# Patient Record
Sex: Male | Born: 1985
Health system: Southern US, Community
[De-identification: ages and names within clinical notes are randomized; demographics above are authoritative.]

## PROBLEM LIST (undated history)

## (undated) DIAGNOSIS — K219 Gastro-esophageal reflux disease without esophagitis: Secondary | ICD-10-CM

## (undated) DIAGNOSIS — K649 Unspecified hemorrhoids: Secondary | ICD-10-CM

## (undated) HISTORY — DX: Unspecified hemorrhoids: K64.9

## (undated) HISTORY — DX: Gastro-esophageal reflux disease without esophagitis: K21.9

---

## 2017-03-25 ENCOUNTER — Ambulatory Visit
Admission: RE | Admit: 2017-03-25 | Discharge: 2017-03-25 | Disposition: A | Discharge status: Home / Self Care | Payer: BC Managed Care – PPO | Source: Ambulatory Visit | Attending: Family Medicine | Admitting: Family Medicine

## 2017-03-25 ENCOUNTER — Other Ambulatory Visit: Payer: Self-pay | Admitting: Family Medicine

## 2017-03-25 DIAGNOSIS — K5900 Constipation, unspecified: Secondary | ICD-10-CM

## 2017-09-24 ENCOUNTER — Telehealth: Payer: Self-pay | Admitting: *Deleted

## 2017-09-24 ENCOUNTER — Ambulatory Visit: Payer: BC Managed Care – PPO | Admitting: Neurology

## 2017-09-24 NOTE — Telephone Encounter (Signed)
No showed new patient appointment. 

## 2017-09-25 ENCOUNTER — Encounter: Payer: Self-pay | Admitting: Neurology

## 2017-10-07 ENCOUNTER — Other Ambulatory Visit: Payer: Self-pay | Admitting: Physician Assistant

## 2017-10-07 DIAGNOSIS — R1032 Left lower quadrant pain: Secondary | ICD-10-CM

## 2017-10-07 DIAGNOSIS — K5900 Constipation, unspecified: Secondary | ICD-10-CM

## 2017-10-23 ENCOUNTER — Other Ambulatory Visit: Payer: BC Managed Care – PPO

## 2019-06-21 DIAGNOSIS — Z20828 Contact with and (suspected) exposure to other viral communicable diseases: Secondary | ICD-10-CM | POA: Diagnosis not present

## 2019-06-21 DIAGNOSIS — J45901 Unspecified asthma with (acute) exacerbation: Secondary | ICD-10-CM | POA: Diagnosis not present

## 2019-06-21 DIAGNOSIS — J209 Acute bronchitis, unspecified: Secondary | ICD-10-CM | POA: Diagnosis not present

## 2019-07-12 DIAGNOSIS — J209 Acute bronchitis, unspecified: Secondary | ICD-10-CM | POA: Diagnosis not present

## 2019-10-06 DIAGNOSIS — F4010 Social phobia, unspecified: Secondary | ICD-10-CM | POA: Diagnosis not present

## 2019-10-06 DIAGNOSIS — F439 Reaction to severe stress, unspecified: Secondary | ICD-10-CM | POA: Diagnosis not present

## 2019-10-14 DIAGNOSIS — F4010 Social phobia, unspecified: Secondary | ICD-10-CM | POA: Diagnosis not present

## 2019-10-14 DIAGNOSIS — F439 Reaction to severe stress, unspecified: Secondary | ICD-10-CM | POA: Diagnosis not present

## 2019-10-21 DIAGNOSIS — F439 Reaction to severe stress, unspecified: Secondary | ICD-10-CM | POA: Diagnosis not present

## 2019-10-21 DIAGNOSIS — F4010 Social phobia, unspecified: Secondary | ICD-10-CM | POA: Diagnosis not present

## 2019-10-28 DIAGNOSIS — F4010 Social phobia, unspecified: Secondary | ICD-10-CM | POA: Diagnosis not present

## 2019-10-28 DIAGNOSIS — F439 Reaction to severe stress, unspecified: Secondary | ICD-10-CM | POA: Diagnosis not present

## 2019-11-18 DIAGNOSIS — F4310 Post-traumatic stress disorder, unspecified: Secondary | ICD-10-CM | POA: Diagnosis not present

## 2019-11-18 DIAGNOSIS — F4010 Social phobia, unspecified: Secondary | ICD-10-CM | POA: Diagnosis not present

## 2019-11-25 DIAGNOSIS — F4310 Post-traumatic stress disorder, unspecified: Secondary | ICD-10-CM | POA: Diagnosis not present

## 2019-11-25 DIAGNOSIS — F4010 Social phobia, unspecified: Secondary | ICD-10-CM | POA: Diagnosis not present

## 2020-10-23 ENCOUNTER — Ambulatory Visit (HOSPITAL_BASED_OUTPATIENT_CLINIC_OR_DEPARTMENT_OTHER)
Admission: RE | Admit: 2020-10-23 | Discharge: 2020-10-23 | Disposition: A | Discharge status: Home / Self Care | Payer: BC Managed Care – PPO | Source: Ambulatory Visit | Attending: Family Medicine | Admitting: Family Medicine

## 2020-10-23 ENCOUNTER — Other Ambulatory Visit (HOSPITAL_BASED_OUTPATIENT_CLINIC_OR_DEPARTMENT_OTHER): Payer: Self-pay | Admitting: Family Medicine

## 2020-10-23 ENCOUNTER — Other Ambulatory Visit: Payer: Self-pay

## 2020-10-23 ENCOUNTER — Encounter (INDEPENDENT_AMBULATORY_CARE_PROVIDER_SITE_OTHER): Payer: Self-pay

## 2020-10-23 DIAGNOSIS — R197 Diarrhea, unspecified: Secondary | ICD-10-CM | POA: Diagnosis not present

## 2020-10-23 DIAGNOSIS — R1033 Periumbilical pain: Secondary | ICD-10-CM

## 2020-10-23 DIAGNOSIS — R102 Pelvic and perineal pain: Secondary | ICD-10-CM | POA: Diagnosis not present

## 2020-10-23 DIAGNOSIS — K648 Other hemorrhoids: Secondary | ICD-10-CM | POA: Diagnosis not present

## 2020-10-23 MED ORDER — IOHEXOL 300 MG/ML  SOLN
100.0000 mL | Freq: Once | INTRAMUSCULAR | Status: AC | PRN
  Administered 2020-10-23: 100 mL via INTRAVENOUS

## 2021-09-24 DIAGNOSIS — M79605 Pain in left leg: Secondary | ICD-10-CM | POA: Diagnosis not present

## 2021-09-24 DIAGNOSIS — R202 Paresthesia of skin: Secondary | ICD-10-CM | POA: Diagnosis not present

## 2021-09-24 DIAGNOSIS — M791 Myalgia, unspecified site: Secondary | ICD-10-CM | POA: Diagnosis not present

## 2021-09-24 DIAGNOSIS — M79604 Pain in right leg: Secondary | ICD-10-CM | POA: Diagnosis not present

## 2021-10-24 DIAGNOSIS — M79661 Pain in right lower leg: Secondary | ICD-10-CM | POA: Diagnosis not present

## 2021-10-24 DIAGNOSIS — M79662 Pain in left lower leg: Secondary | ICD-10-CM | POA: Diagnosis not present

## 2021-11-19 DIAGNOSIS — M79671 Pain in right foot: Secondary | ICD-10-CM | POA: Diagnosis not present

## 2021-11-19 DIAGNOSIS — M2011 Hallux valgus (acquired), right foot: Secondary | ICD-10-CM | POA: Diagnosis not present

## 2021-11-19 DIAGNOSIS — M2012 Hallux valgus (acquired), left foot: Secondary | ICD-10-CM | POA: Diagnosis not present

## 2022-02-21 DIAGNOSIS — R41840 Attention and concentration deficit: Secondary | ICD-10-CM | POA: Diagnosis not present

## 2022-02-21 DIAGNOSIS — Z0000 Encounter for general adult medical examination without abnormal findings: Secondary | ICD-10-CM | POA: Diagnosis not present

## 2022-02-21 DIAGNOSIS — M79604 Pain in right leg: Secondary | ICD-10-CM | POA: Diagnosis not present

## 2022-02-21 DIAGNOSIS — Z23 Encounter for immunization: Secondary | ICD-10-CM | POA: Diagnosis not present

## 2022-07-24 DIAGNOSIS — Z800 Family history of malignant neoplasm of digestive organs: Secondary | ICD-10-CM | POA: Diagnosis not present

## 2022-07-24 DIAGNOSIS — R634 Abnormal weight loss: Secondary | ICD-10-CM | POA: Diagnosis not present

## 2022-07-24 DIAGNOSIS — R1032 Left lower quadrant pain: Secondary | ICD-10-CM | POA: Diagnosis not present

## 2022-07-24 DIAGNOSIS — R197 Diarrhea, unspecified: Secondary | ICD-10-CM | POA: Diagnosis not present

## 2022-07-31 DIAGNOSIS — R197 Diarrhea, unspecified: Secondary | ICD-10-CM | POA: Diagnosis not present

## 2022-10-06 DIAGNOSIS — K6389 Other specified diseases of intestine: Secondary | ICD-10-CM | POA: Diagnosis not present

## 2022-10-06 DIAGNOSIS — R1032 Left lower quadrant pain: Secondary | ICD-10-CM | POA: Diagnosis not present

## 2022-10-06 DIAGNOSIS — Z800 Family history of malignant neoplasm of digestive organs: Secondary | ICD-10-CM | POA: Diagnosis not present

## 2022-10-06 DIAGNOSIS — R197 Diarrhea, unspecified: Secondary | ICD-10-CM | POA: Diagnosis not present

## 2022-10-06 DIAGNOSIS — K621 Rectal polyp: Secondary | ICD-10-CM | POA: Diagnosis not present

## 2022-10-06 DIAGNOSIS — K635 Polyp of colon: Secondary | ICD-10-CM | POA: Diagnosis not present

## 2022-10-06 DIAGNOSIS — R634 Abnormal weight loss: Secondary | ICD-10-CM | POA: Diagnosis not present

## 2022-10-06 DIAGNOSIS — K648 Other hemorrhoids: Secondary | ICD-10-CM | POA: Diagnosis not present

## 2022-10-06 DIAGNOSIS — D120 Benign neoplasm of cecum: Secondary | ICD-10-CM | POA: Diagnosis not present

## 2023-01-08 DIAGNOSIS — K648 Other hemorrhoids: Secondary | ICD-10-CM | POA: Diagnosis not present

## 2023-02-24 DIAGNOSIS — Z13220 Encounter for screening for lipoid disorders: Secondary | ICD-10-CM | POA: Diagnosis not present

## 2023-02-24 DIAGNOSIS — Z125 Encounter for screening for malignant neoplasm of prostate: Secondary | ICD-10-CM | POA: Diagnosis not present

## 2023-02-24 DIAGNOSIS — Z0000 Encounter for general adult medical examination without abnormal findings: Secondary | ICD-10-CM | POA: Diagnosis not present

## 2023-02-26 IMAGING — CT CT ABD-PELV W/ CM
2 of 4 series · 16 of 46 positions shown, 18 images · IV contrast (Omnipaque)
Comparison: Plain films 03/25/2017

CLINICAL DATA: Periumbilical and lower pelvic pain. Worsening over
last 2 weeks. Chronic hemorrhoids.

EXAM:
CT ABDOMEN AND PELVIS WITH CONTRAST
TECHNIQUE: Multidetector CT imaging of the abdomen and pelvis was performed
using the standard protocol following bolus administration of
intravenous contrast.
CONTRAST:  100mL OMNIPAQUE IOHEXOL 300 MG/ML  SOLN

[Series 2: axial st · axial · 0.78mm/px · z∈[+241,+636]mm · 13 of 87 slices shown, 15 images]
[im 4/87  soft-tissue]
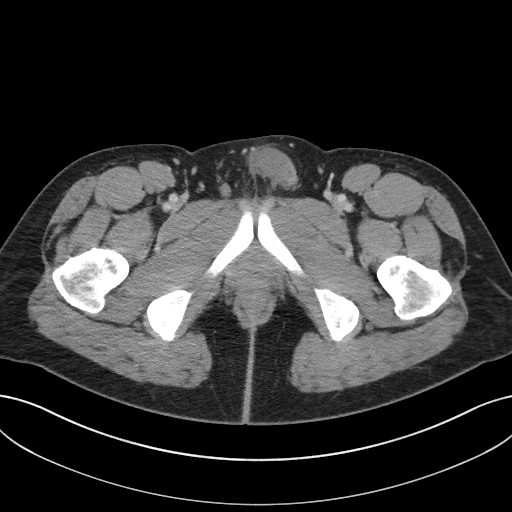
[im 4/87  bone]
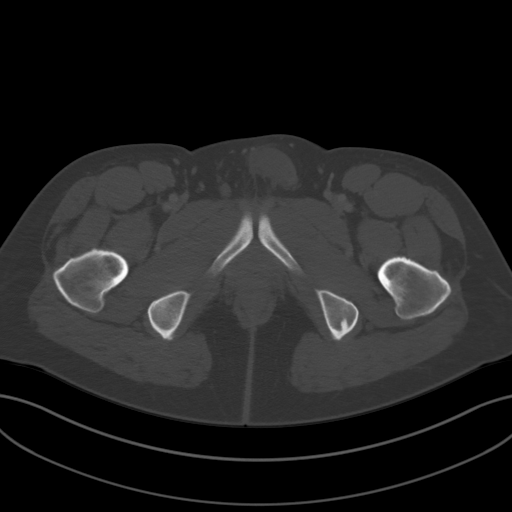
[im 11/87  soft-tissue]
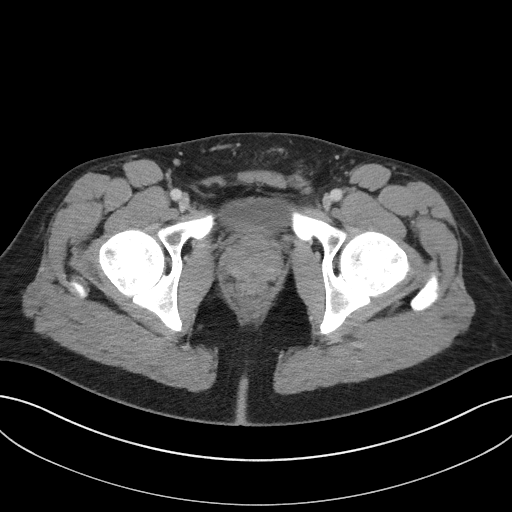
[im 18/87  soft-tissue]
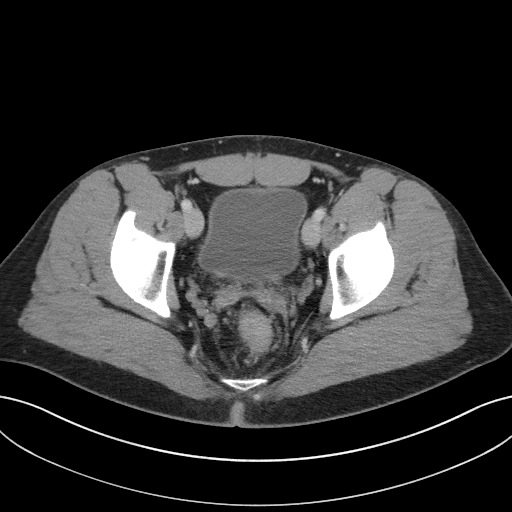
[im 26/87  soft-tissue]
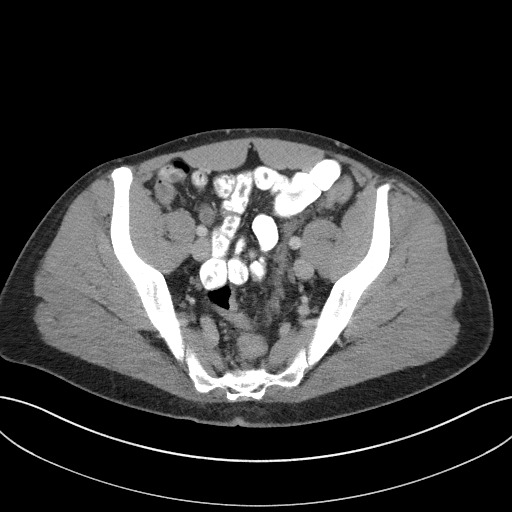
[im 29/87  soft-tissue]
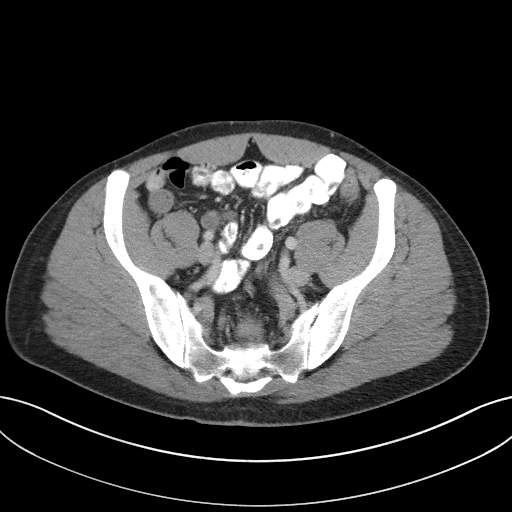
[im 36/87  soft-tissue]
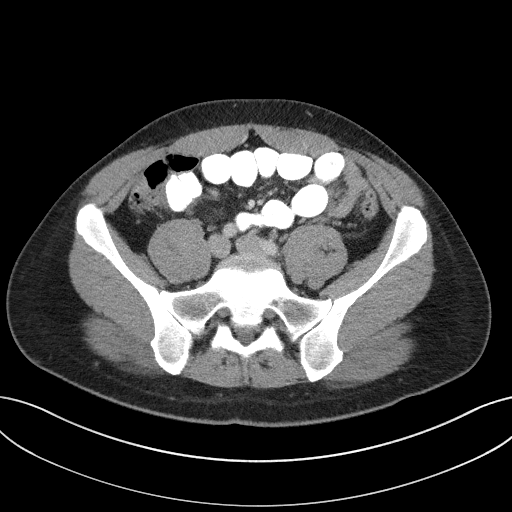
[im 44/87  soft-tissue]
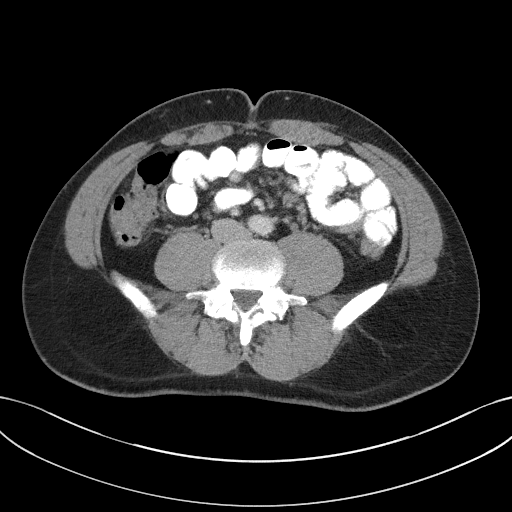
[im 51/87  soft-tissue]
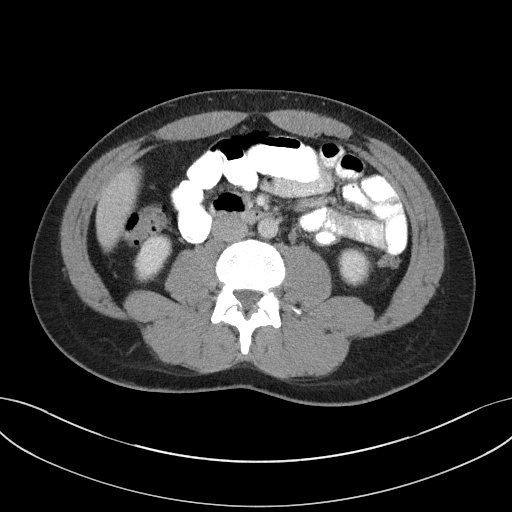
[im 58/87  soft-tissue]
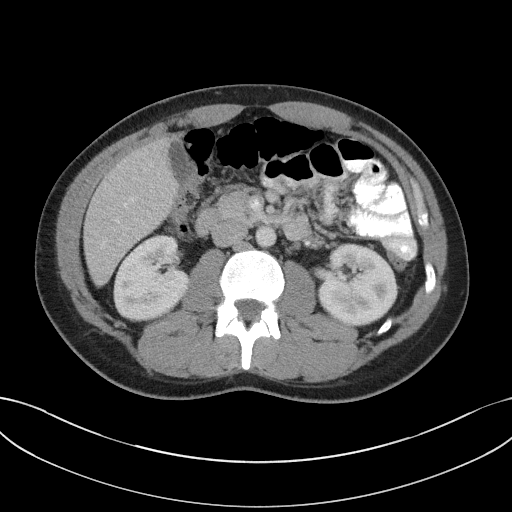
[im 58/87  bone]
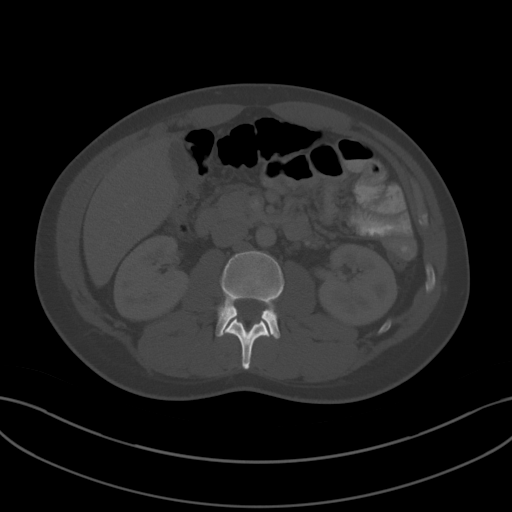
[im 61/87  soft-tissue]
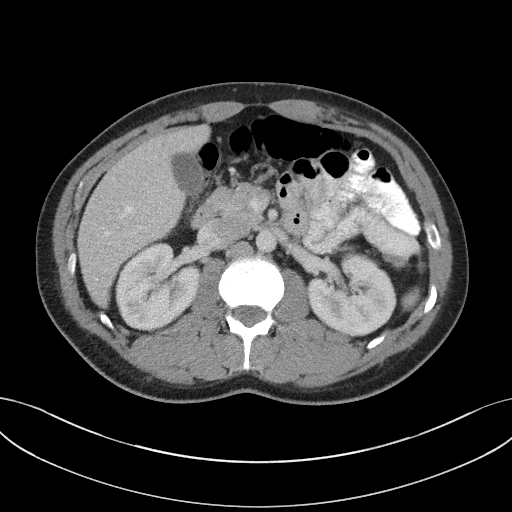
[im 69/87  soft-tissue]
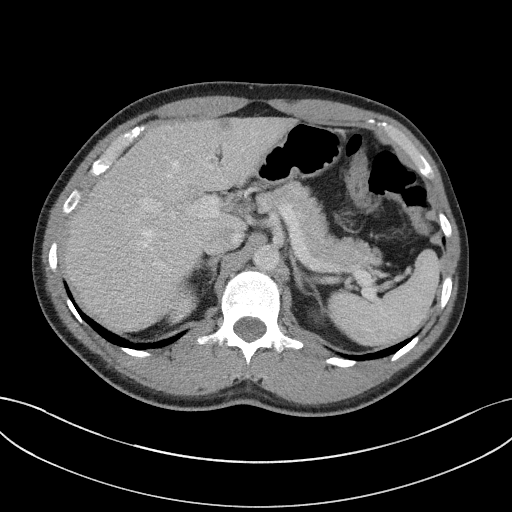
[im 76/87  soft-tissue]
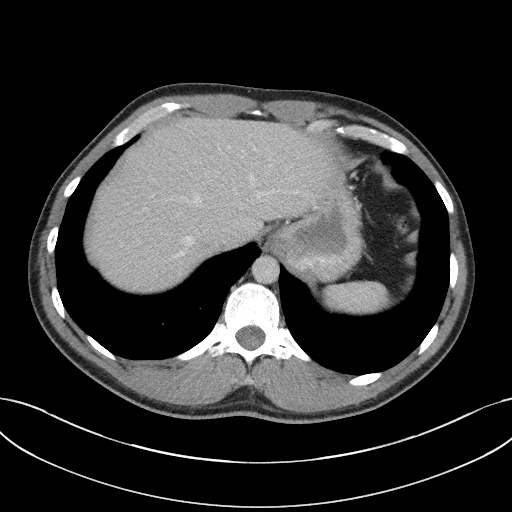
[im 83/87  soft-tissue]
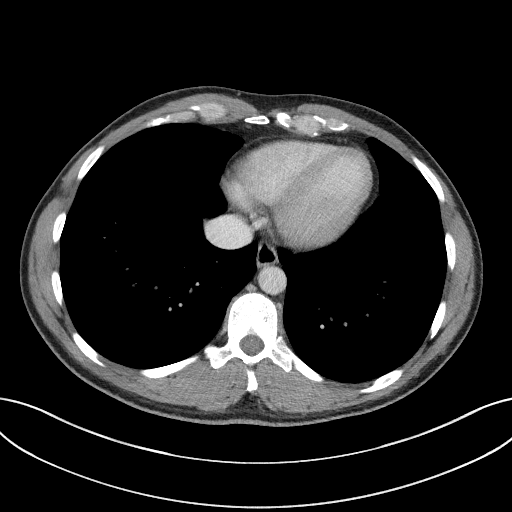

[Series 5: coronal st · coronal · 0.75mm/px · 3 of 85 slices shown]
[im 29/85  soft-tissue]
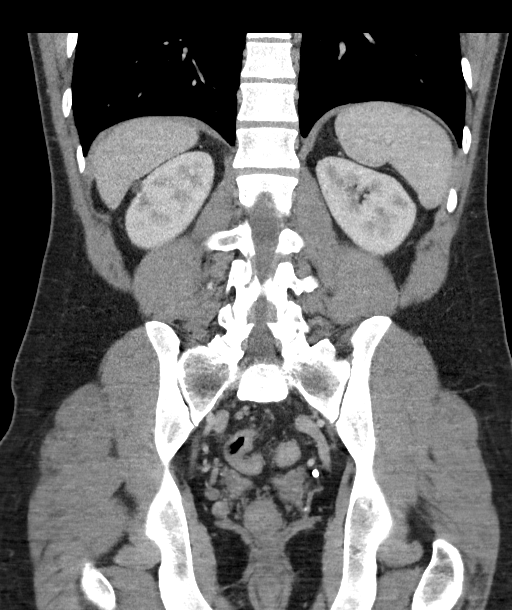
[im 38/85  soft-tissue]
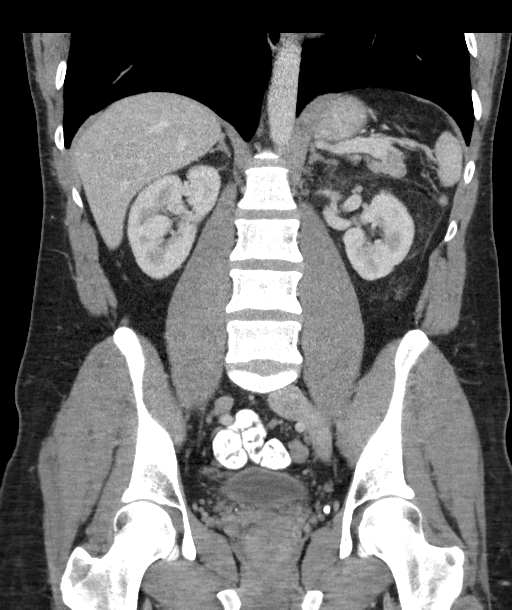
[im 47/85  soft-tissue]
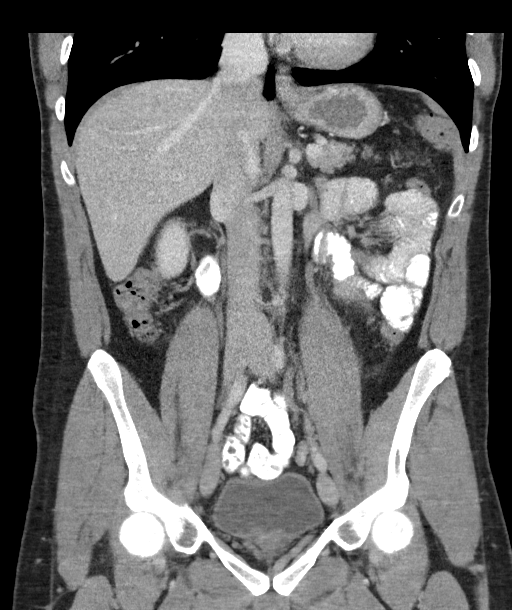

[16 of 46 positions shown; findings below may reference images not displayed]

FINDINGS: Lower chest: Clear lung bases. Normal heart size without pericardial
or pleural effusion.

Hepatobiliary: Focal steatosis adjacent the falciform ligament.
Normal gallbladder, without biliary ductal dilatation.

Pancreas: Normal, without mass or ductal dilatation.

Spleen: Normal in size, without focal abnormality.

Adrenals/Urinary Tract: Normal adrenal glands. Interpolar right
renal too small to characterize 8 mm lesion is most likely a cyst.
Suspect punctate bilateral renal collecting system calculi, most
apparent on coronal reformats. Normal urinary bladder.

Stomach/Bowel: Normal stomach, without wall thickening. Normal colon
and terminal ileum. Appendix is not visualized but there is no
evidence of right lower quadrant inflammation. Normal small bowel.

Vascular/Lymphatic: Normal caliber of the aorta and branch vessels.
No abdominopelvic adenopathy.

Reproductive: Normal prostate. Probable inguinal position of the
left testicle including on 87/2.

Other: No significant free fluid.

Musculoskeletal: No acute osseous abnormality.
IMPRESSION: 1. Suspected nephrolithiasis, suboptimally evaluated on this
post-contrast exam. No obstructive uropathy.
2. No other explanation for pain.
3. Probable inguinal position of the left testicle. Consider
physical exam correlation.

## 2023-03-03 DIAGNOSIS — Z131 Encounter for screening for diabetes mellitus: Secondary | ICD-10-CM | POA: Diagnosis not present

## 2023-05-12 DIAGNOSIS — E7800 Pure hypercholesterolemia, unspecified: Secondary | ICD-10-CM | POA: Diagnosis not present

## 2023-05-12 DIAGNOSIS — F988 Other specified behavioral and emotional disorders with onset usually occurring in childhood and adolescence: Secondary | ICD-10-CM | POA: Diagnosis not present

## 2023-05-12 DIAGNOSIS — Z23 Encounter for immunization: Secondary | ICD-10-CM | POA: Diagnosis not present

## 2023-05-12 DIAGNOSIS — Z634 Disappearance and death of family member: Secondary | ICD-10-CM | POA: Diagnosis not present
